# Patient Record
Sex: Female | Born: 1985 | Race: White | Hispanic: No | Marital: Single | Smoking: Never smoker
Health system: Southern US, Community
[De-identification: ages and names within clinical notes are randomized; demographics above are authoritative.]

## PROBLEM LIST (undated history)

## (undated) DIAGNOSIS — J329 Chronic sinusitis, unspecified: Secondary | ICD-10-CM

## (undated) DIAGNOSIS — R51 Headache: Secondary | ICD-10-CM

## (undated) HISTORY — PX: NASAL SINUS SURGERY: SHX719

## (undated) HISTORY — DX: Chronic sinusitis, unspecified: J32.9

## (undated) HISTORY — DX: Headache: R51

---

## 2005-07-13 ENCOUNTER — Ambulatory Visit: Payer: Self-pay | Admitting: Internal Medicine

## 2005-07-23 ENCOUNTER — Ambulatory Visit: Payer: Self-pay | Admitting: Cardiology

## 2006-02-15 ENCOUNTER — Ambulatory Visit: Payer: Self-pay | Admitting: Pulmonary Disease

## 2006-08-26 ENCOUNTER — Ambulatory Visit: Payer: Self-pay | Admitting: Internal Medicine

## 2007-03-18 ENCOUNTER — Ambulatory Visit: Payer: Self-pay | Admitting: Internal Medicine

## 2007-03-31 ENCOUNTER — Ambulatory Visit: Payer: Self-pay | Admitting: Cardiology

## 2007-10-31 ENCOUNTER — Ambulatory Visit: Payer: Self-pay | Admitting: Pulmonary Disease

## 2007-11-03 ENCOUNTER — Ambulatory Visit: Payer: Self-pay | Admitting: Pulmonary Disease

## 2007-11-03 LAB — CONVERTED CEMR LAB
Alkaline Phosphatase: 64 units/L (ref 39–117)
BUN: 8 mg/dL (ref 6–23)
Basophils Absolute: 0.1 10*3/uL (ref 0.0–0.1)
Basophils Relative: 0.7 % (ref 0.0–1.0)
Bilirubin, Direct: 0.2 mg/dL (ref 0.0–0.3)
CO2: 23 meq/L (ref 19–32)
Eosinophils Absolute: 0.2 10*3/uL (ref 0.0–0.6)
Eosinophils Relative: 1.9 % (ref 0.0–5.0)
GFR calc Af Amer: 135 mL/min
GFR calc non Af Amer: 111 mL/min
Glucose, Bld: 99 mg/dL (ref 70–99)
Hemoglobin: 13.1 g/dL (ref 12.0–15.0)
MCHC: 34.6 g/dL (ref 30.0–36.0)
MCV: 89.7 fL (ref 78.0–100.0)
Monocytes Relative: 5.7 % (ref 3.0–11.0)
Neutro Abs: 5.1 10*3/uL (ref 1.4–7.7)
RBC: 4.22 M/uL (ref 3.87–5.11)
Sodium: 138 meq/L (ref 135–145)
Total Bilirubin: 0.8 mg/dL (ref 0.3–1.2)
Total Protein: 7.2 g/dL (ref 6.0–8.3)

## 2007-11-10 ENCOUNTER — Ambulatory Visit: Payer: Self-pay | Admitting: Pulmonary Disease

## 2007-11-10 LAB — CONVERTED CEMR LAB
ALT: 15 U/L
AST: 19 U/L
Albumin: 4 g/dL
Alkaline Phosphatase: 64 U/L
BUN: 8 mg/dL
Basophils Absolute: 0.1 K/uL
Basophils Relative: 0.7 %
Bilirubin, Direct: 0.2 mg/dL
CO2: 23 meq/L
Calcium: 9.9 mg/dL
Chloride: 105 meq/L
Creatinine, Ser: 0.7 mg/dL
Eosinophils Absolute: 0.2 K/uL
Eosinophils Relative: 1.9 %
GFR calc Af Amer: 135 mL/min
GFR calc non Af Amer: 111 mL/min
Glucose, Bld: 99 mg/dL
HCT: 37.9 %
Hemoglobin: 13.1 g/dL
Lymphocytes Relative: 31.1 %
MCHC: 34.6 g/dL
MCV: 89.7 fL
Monocytes Absolute: 0.5 K/uL
Monocytes Relative: 5.7 %
Neutro Abs: 5.1 K/uL
Neutrophils Relative %: 60.6 %
Platelets: 316 K/uL
Potassium: 4.1 meq/L
RBC: 4.22 M/uL
RDW: 11.1 % — ABNORMAL LOW
Sodium: 138 meq/L
TSH: 4.58 u[IU]/mL
Total Bilirubin: 0.8 mg/dL
Total Protein: 7.2 g/dL
WBC: 8.6 10*3/microliter

## 2007-11-17 DIAGNOSIS — J329 Chronic sinusitis, unspecified: Secondary | ICD-10-CM | POA: Insufficient documentation

## 2007-11-17 DIAGNOSIS — R519 Headache, unspecified: Secondary | ICD-10-CM | POA: Insufficient documentation

## 2007-11-17 DIAGNOSIS — R51 Headache: Secondary | ICD-10-CM | POA: Insufficient documentation

## 2008-02-05 ENCOUNTER — Telehealth: Payer: Self-pay | Admitting: Adult Health

## 2008-06-28 ENCOUNTER — Ambulatory Visit: Payer: Self-pay | Admitting: Internal Medicine

## 2008-12-20 ENCOUNTER — Ambulatory Visit: Payer: Self-pay | Admitting: Pulmonary Disease

## 2009-01-20 ENCOUNTER — Encounter: Payer: Self-pay | Admitting: Adult Health

## 2009-01-20 ENCOUNTER — Ambulatory Visit: Payer: Self-pay | Admitting: Internal Medicine

## 2009-01-20 DIAGNOSIS — K5289 Other specified noninfective gastroenteritis and colitis: Secondary | ICD-10-CM

## 2009-01-24 ENCOUNTER — Encounter: Payer: Self-pay | Admitting: Adult Health

## 2009-01-24 LAB — CONVERTED CEMR LAB
BUN: 8 mg/dL (ref 6–23)
Basophils Absolute: 0 10*3/uL (ref 0.0–0.1)
Basophils Relative: 0.1 % (ref 0.0–3.0)
Bilirubin Urine: NEGATIVE
Creatinine, Ser: 0.6 mg/dL (ref 0.4–1.2)
Crystals: NEGATIVE
GFR calc Af Amer: 159 mL/min
GFR calc non Af Amer: 132 mL/min
Glucose, Bld: 94 mg/dL (ref 70–99)
Ketones, ur: NEGATIVE mg/dL
MCV: 90.6 fL (ref 78.0–100.0)
Monocytes Absolute: 0.5 10*3/uL (ref 0.1–1.0)
Nitrite: NEGATIVE
Platelets: 253 10*3/uL (ref 150–400)
RBC: 4.23 M/uL (ref 3.87–5.11)
Total Bilirubin: 0.3 mg/dL (ref 0.3–1.2)
Total Protein: 6.8 g/dL (ref 6.0–8.3)
WBC: 8.3 10*3/uL (ref 4.5–10.5)

## 2009-01-27 ENCOUNTER — Ambulatory Visit: Payer: Self-pay | Admitting: Adult Health

## 2009-01-27 ENCOUNTER — Ambulatory Visit (HOSPITAL_COMMUNITY): Admission: RE | Admit: 2009-01-27 | Discharge: 2009-01-27 | Payer: Self-pay | Admitting: Internal Medicine

## 2009-01-27 ENCOUNTER — Telehealth (INDEPENDENT_AMBULATORY_CARE_PROVIDER_SITE_OTHER): Payer: Self-pay | Admitting: *Deleted

## 2009-01-28 ENCOUNTER — Ambulatory Visit: Payer: Self-pay | Admitting: Internal Medicine

## 2009-01-28 LAB — CONVERTED CEMR LAB
Lipase: 22 units/L (ref 11.0–59.0)
hCG, Beta Chain, Quant, S: 2.65 milliintl units/mL

## 2009-02-01 ENCOUNTER — Emergency Department (HOSPITAL_COMMUNITY): Admission: EM | Admit: 2009-02-01 | Discharge: 2009-02-01 | Payer: Self-pay | Admitting: Family Medicine

## 2009-06-07 ENCOUNTER — Emergency Department (HOSPITAL_COMMUNITY): Admission: EM | Admit: 2009-06-07 | Discharge: 2009-06-07 | Payer: Self-pay | Admitting: Family Medicine

## 2009-07-25 ENCOUNTER — Telehealth (INDEPENDENT_AMBULATORY_CARE_PROVIDER_SITE_OTHER): Payer: Self-pay | Admitting: *Deleted

## 2009-09-08 ENCOUNTER — Telehealth: Payer: Self-pay | Admitting: Internal Medicine

## 2009-09-27 ENCOUNTER — Telehealth: Payer: Self-pay | Admitting: Internal Medicine

## 2009-10-25 ENCOUNTER — Encounter: Payer: Self-pay | Admitting: Internal Medicine

## 2009-11-15 ENCOUNTER — Telehealth: Payer: Self-pay | Admitting: Internal Medicine

## 2009-11-18 ENCOUNTER — Ambulatory Visit: Payer: Self-pay | Admitting: Cardiovascular Disease

## 2010-01-10 ENCOUNTER — Ambulatory Visit: Payer: Self-pay | Admitting: Internal Medicine

## 2010-01-10 DIAGNOSIS — J209 Acute bronchitis, unspecified: Secondary | ICD-10-CM

## 2010-01-16 ENCOUNTER — Telehealth: Payer: Self-pay | Admitting: Internal Medicine

## 2010-01-26 ENCOUNTER — Telehealth: Payer: Self-pay | Admitting: Internal Medicine

## 2010-11-04 ENCOUNTER — Telehealth: Payer: Self-pay | Admitting: Internal Medicine

## 2011-01-16 NOTE — Progress Notes (Signed)
Summary: SICK  Phone Note Call from Patient Call back at Home Phone [phone]   Caller: Patient Call For: Leisel Pinette Reason for Call: Acute Illness Summary of Call: Pt called stating that she continues to cough and sore throat. Pt was given a Zpak at OV. Please advise what pt can take or if pt can come in on Tuesday to be seen and get depo injection.  Initial call taken by: Reynaldo Minium CMA,  January 16, 2010 9:15 AM  Follow-up for Phone Call        I have some Avelox samples for her If this doesn't work we can call in some prednisone or she can come for depo inj. Try Delsym for cough otc. Follow-up by: Waymon Budge MD,  January 16, 2010 1:43 PM  Additional Follow-up for Phone Call Additional follow up Details #1::        Pt aware and will most likely send her mother to pick up samples. Reynaldo Minium CMA  January 16, 2010 1:47 PM

## 2011-01-16 NOTE — Assessment & Plan Note (Signed)
Summary: ov//td   CC:  cough-productive x 2 days-green; recently had sore throat;Denies N, V, and & D. No fever for past couple days; with sore thraot fever of 100.3..  History of Present Illness: 25 year old female with known history of Allergic Rhinitis  January 20, 2009--Presents for  2 weeks of upset stomach, bloating, gas, cramping, intermittent episodes, over last 2 weeks, increased over last few days. Some nausea, no vomitting, or diarrhea, BM normal, Denies chest pain, dyspnea, orthopnea, hemoptysis, fever, n/v/d, edema, recent travel or antibiotics, bloody stools, urinary symtptoms. , LMP nml. No OTC used.   January 28, 2009--Returns for persistent abd pain-lower cramping, upset stomach, nausea,  menses q39m on nuvaring. Does not feel better  since last OV - has been taking the nexium but stomach pain is getting worse, still nausea/vomting/hot flashes intermittently. Under alot of stress, work school, and family. Denies chest pain, dyspnea, orthopnea, hemoptysis,  edema, headache,recent travel or antibiotics, bloody stools. Abd US done normal, amylase/lipase -nml, preg neg.   January 10, 2010-  Acute visit. Bad sore throat with fever 1-2 weeks ago.Now morning cough green. Denies sinus congestion (hx sinus surgery). Residual hoarseness. No recent antibiotic. Taking Mucinex and Nyquil.   Preventive Screening-Counseling & Management  Alcohol-Tobacco     Smoking Status: never  Current Medications (verified): 1)  Nuvaring 0.12-0.015 Mg/24hr  Ring (Etonogestrel-Ethinyl Estradiol) .... As Directed 2)  Sonata 10 Mg  Caps (Zaleplon) .... As Needed 3)  Maxalt 10 Mg  Tabs (Rizatriptan Benzoate) .... Take As Directed For Headache  Allergies (verified): 1)  ! Vicodin (Hydrocodone-Acetaminophen)  Past History:  Past Medical History: Last updated: 06/28/2008  SINUSITIS (ICD-473.9) HEADACHE (ICD-784.0)    Family History: Last updated: 01/10/2010 Mother- living, COPD Father-  living  Social History: Last updated: 01/10/2010 Patient never smoked.  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  The patient denies anorexia, fever, weight loss, weight gain, vision loss, decreased hearing, hoarseness, chest pain, syncope, peripheral edema, headaches, hemoptysis, abdominal pain, and severe indigestion/heartburn.    Vital Signs:  Patient profile:   25 year old female Height:      62 inches Weight:      164.50 pounds BMI:     30.20 O2 Sat:      100 % on Room air Pulse rate:   83 / minute BP sitting:   112 / 82  (left arm) Cuff size:   regular  Vitals Entered By: Reynaldo Minium CMA (January 10, 2010 11:41 AM)  O2 Flow:  Room air  Physical Exam  Additional Exam:  GENERAL:  A/Ox3; pleasant & cooperative.NAD HEENT:  [place]/AT, EOM-wnl, PERRLA, EACs-clear, TMs-wnl, NOSE-watery sniffing , sinus tenderness, THROAT-clear & wnl.[place]  II NECK:  Supple w/ fair ROM; no JVD; normal carotid impulses w/o bruits; no thyromegaly or nodules palpated; no lymphadenopathy. CHEST:  Clear to P & A; intermittent dry cough but no wheeze or rhonchi HEART:  RRR, no m/r/g  heard ABDOMEN:  Soft & nt; EXT: Warm bilat,  no calf pain, edema, clubbing, pulses intact Skin: no rash/lesion    Impression & Recommendations:  Problem # 1:  BRONCHITIS, ACUTE (ICD-466.0)  This has been mostly viral pattern, but productive cough is lingering. Will give Z pak, encourage fluids.  Limited finances/ no insurance. Discussed calling as needed. The following medications were removed from the medication list:    Amoxicillin 500 Mg  Caps (Amoxicillin) .Marland Kitchen... 1 three times a day    Doxycycline Hyclate 100 Mg Tabs (Doxycycline hyclate) .Marland Kitchen... Take 2 today then 1 daily Her updated medication list for this problem includes:    Zithromax Z-pak 250 Mg Tabs (Azithromycin) .Marland Kitchen... 2 today then one daily  Medications Added to Medication List This Visit: 1)  Zithromax Z-pak 250 Mg Tabs (Azithromycin) .... 2 today then one daily  Other Orders: No Charge Patient Arrived (NCPA0) (NCPA0)  Patient Instructions: 1)  Please schedule a follow-up appointment as needed. 2)  Script for z pak. 3)  Fluids, mucinex and Nyquil as needed. Prescriptions: ZITHROMAX Z-PAK 250 MG TABS (AZITHROMYCIN) 2 today then one daily  #1 pak x 1   Entered and Authorized by:   Waymon Budge MD   Signed by:   Waymon Budge MD on 01/10/2010   Method used:   Print then Give to Patient   RxID:   [id number]

## 2011-01-16 NOTE — Progress Notes (Signed)
Summary: Perisitent rhinosinusitis, bronchitis  Phone Note Call from Patient   Summary of Call: Persistent cough over past month after doxycycline, Avelox x 5 days, Delsym, Mucinex. Occasional green, low grade fever and headaches. Hx sinus surgery. Plan- cough syrup, samples Avelox  Initial call taken by: Waymon Budge MD,  January 26, 2010 1:17 PM    New/Updated Medications: PROMETHAZINE-CODEINE 6.25-10 MG/5ML SYRP (PROMETHAZINE-CODEINE) 1 teaspoon four times a day as needed cough AVELOX 400 MG TABS (MOXIFLOXACIN HCL) 1 daily x 6 days Prescriptions: AVELOX 400 MG TABS (MOXIFLOXACIN HCL) 1 daily x 6 days  #6 x 0   Entered and Authorized by:   Waymon Budge MD   Signed by:   Waymon Budge MD on 01/26/2010   Method used:   Samples Given   RxID:   [id number] PROMETHAZINE-CODEINE 6.25-10 MG/5ML SYRP (PROMETHAZINE-CODEINE) 1 teaspoon four times a day as needed cough  #200 ml x 0   Entered and Authorized by:   Waymon Budge MD   Signed by:   Waymon Budge MD on 01/26/2010   Method used:   Print then Give to Patient   RxID:   [id number]

## 2011-01-16 NOTE — Progress Notes (Signed)
Summary: bronchitis- biaxin  Phone Note Call from Patient   Summary of Call: Patient called reporting inability to shake rhinitis/ bronchits syndrome that began over a week ago as a cold. Now working in [place], [place].  Plan- fluids, conservative Rx. Script for biaxin 500 mg two times a day x 7 days, ref x 3 called to [facility] [phone]. Initial call taken by: Waymon Budge MD,  November 04, 2010 6:07 PM    New/Updated Medications: CLARITHROMYCIN 500 MG TABS (CLARITHROMYCIN) 1 two times a day after meals Prescriptions: CLARITHROMYCIN 500 MG TABS (CLARITHROMYCIN) 1 two times a day after meals  #14 x 3   Entered and Authorized by:   Waymon Budge MD   Signed by:   Waymon Budge MD on 11/04/2010   Method used:   Telephoned to ...       [facility] [address]* (retail)       [address]       [place], [place]  [address]       Ph: [phone]       Fax:    RxID:   [id number]

## 2011-04-27 ENCOUNTER — Inpatient Hospital Stay (INDEPENDENT_AMBULATORY_CARE_PROVIDER_SITE_OTHER)
Admission: RE | Admit: 2011-04-27 | Discharge: 2011-04-27 | Disposition: A | Discharge status: Home / Self Care | Payer: Self-pay | Source: Ambulatory Visit | Attending: Emergency Medicine | Admitting: Emergency Medicine

## 2011-04-27 DIAGNOSIS — G43009 Migraine without aura, not intractable, without status migrainosus: Secondary | ICD-10-CM

## 2011-05-01 NOTE — Assessment & Plan Note (Signed)
[facility]                             PULMONARY OFFICE NOTE   Hailey Simpson, Hailey Simpson                  MRN:          [id number]  DATE:10/31/2007                            DOB:          1986/09/01    HISTORY OF PRESENT ILLNESS:  The patient is a very pleasant 25 year old  white female patient of Dr. Roxy Cedar with a history of rhinitis, who  presents today for an acute office visit.  The patient complains that  she has been having a lot of stress recently.  She is currently in  school full-time and working full-time.  She has felt as if she has not  been as happy as her usual self over the last 6-12 months.  She has  frequent episodes of crying and worries quite a bit.  The patient denies  any chest pain, palpitations, nausea, vomiting, rash or suicidal or  homicidal ideations.  The patient does feel that she does want to sleep  more than usual.  The patient had a short episode 3 years ago when she  was on Prozac for a couple of months, but did not seem to have a good  response with this.   PAST MEDICAL HISTORY:  Reviewed.   CURRENT MEDICATIONS:  Reviewed.   PHYSICAL EXAMINATION:  The patient is a very pleasant female in no acute  distress.  She is afebrile with stable vital signs.  HEENT:  Unremarkable.  NECK:  Supple without cervical adenopathy.  No JVD.  LUNGS:  Lung sounds are clear.  CARDIAC:  Regular rate and rhythm.  ABDOMEN:  Soft and nontender.  No palpable hepatosplenomegaly.  EXTREMITIES:  Warm without any calf cyanosis, clubbing or edema.   IMPRESSION AND PLAN:  Depression.  The patient is recommended on stress-  reducing activities such as exercise.  I have recommend that she be  referred over to Psychology to help with stress-coping management with  Dr. Laban Emperor Hires.  An appointment will be made accordingly.  The  patient is to begin Cymbalta 30 mg for 2 weeks and then 60 mg daily.  The patient will return back here in 4 weeks  in our office or sooner if  needed.  The patient is advised to call the office for sooner followup  if symptoms do not improve or worsen.     Rubye Oaks, NP  Electronically Signed      Clinton D. Maple Hudson, MD, [facility], FACP  Electronically Signed   TP/MedQ  DD: 10/31/2007  DT: 11/02/2007  Job #: [id number]

## 2011-05-04 NOTE — Assessment & Plan Note (Signed)
[facility]                               PULMONARY OFFICE NOTE   BERNESTINE, HOLSAPPLE                  MRN:          [id number]  DATE:08/26/2006                            DOB:          Oct 30, 1986    HISTORY OF PRESENT ILLNESS:  The patient is a very pleasant female patient  of Dr. Roxy Cedar who presents for an acute office visit.  The patient  complained of a 2-day history of nasal congestion, postnasal drip, dry  cough, and general malaise.  The patient denies any hemoptysis, purulent  sputum, chest pain, recent travel, or antibiotic use.   PAST MEDICAL HISTORY:  Was reviewed.   CURRENT MEDICATIONS:  Nuvaring.   ALLERGIES:  No known drug allergies.   PHYSICAL EXAMINATION:  GENERAL:  The patient is a pleasant female in no  acute distress.  VITAL SIGNS:  She is afebrile with stable  vital signs.  HEENT:  Nasopharynx shows some moderate erythema.  Nontender sinus  percussion.  Oropharynx clear.  TMs are normal.  NECK:  Supple without adenopathy.  LUNGS:  Lung sounds are clear.  CARDIAC:  Regular rate and rhythm.  ABDOMEN:  Soft, benign.  EXTREMITIES:  Warm without any edema.   IMPRESSION AND PLAN:  Acute upper respiratory infection, probable viral in  nature and acute rhinitis.  The patient will begin Nasacort AQ, 2 puffs  twice daily; Allegra-D 1 b.i.d., 5 day samples given.  Mucinex-DM twice  daily.  Endal HC as needed for cough.  The patient is return if no  improvement or symptoms worsen.                                   Rubye Oaks, NP                                Clinton D. Maple Hudson, MD, [facility], FACP   TP/MedQ  DD:  08/26/2006  DT:  08/27/2006  Job #:  [id number]

## 2011-05-04 NOTE — Assessment & Plan Note (Signed)
[facility]                             PULMONARY OFFICE NOTE   JULENA, BARBOUR                  MRN:          [id number]  DATE:03/18/2007                            DOB:          1986-05-20    PULMONARY OFFICE FOLLOWUP   PROBLEM:  1. Headache.  2. Sinusitis.   HISTORY:  Has been waking for the past week and a half with retro-  orbital pressures, especially on the right and in the right maxillary  region.  Somewhat bloody scant nasal discharge.  Ears and chest were  okay.  No real fever.  She feels this fits the pattern of her previous  sinus infections.   MEDICATIONS:  1. Nuvaring.  2. P.r.n. Sonata.   Drug intolerant VICODIN with itch.   OBJECTIVE:  Weight 147 pounds.  BP 102/76, pulse regular 84.  Room air  saturation 98%.  Nasal mucosa is a little bit reddened with moderate amount of non-  specific mucus, no polyps.  Pharynx is also a little bit reddened.  I do  not find adenopathy.  Voice quality is normal without strider.  Conjunctivae are clear.  Breathing is unlabored without cough or wheeze.   IMPRESSION:  Rhinitis or rhinosinusitis.   PLAN:  Mucinex D, saline nasal lavage,  Biaxin 500 mg b.i.d.  for 7  days.  Return p.r.n.     Clinton D. Maple Hudson, MD, [facility]ollman, FACP  Electronically Signed    CDY/MedQ  DD: 03/18/2007  DT: 03/19/2007  Job #: [id number]

## 2011-06-27 IMAGING — CT CT PARANASAL SINUSES LIMITED
1 of 2 series · 15 of 25 positions shown, 19 images · non-contrast
Comparison: 03/31/2007.

CLINICAL DATA: Sinusitis and recurrent headaches.  Pain and
pressure around the hives for 1.5 months.

CT PARANASAL SINUS LIMITED WITHOUT CONTRAST
TECHNIQUE: Multidetector CT images of the paranasal sinuses were
obtained in a single plane without contrast.

[Series 4: ltd sinus 3.0 h30s · axial · 0.29mm/px · z∈[-86,+12]mm · 15 of 24 slices shown, 19 images]
[im 2/24  brain]
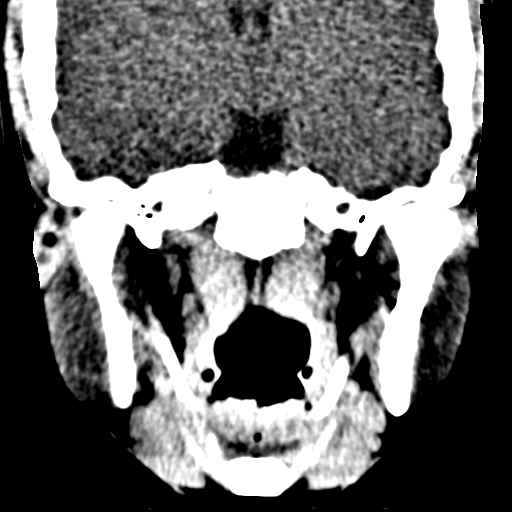
[im 2/24  bone]
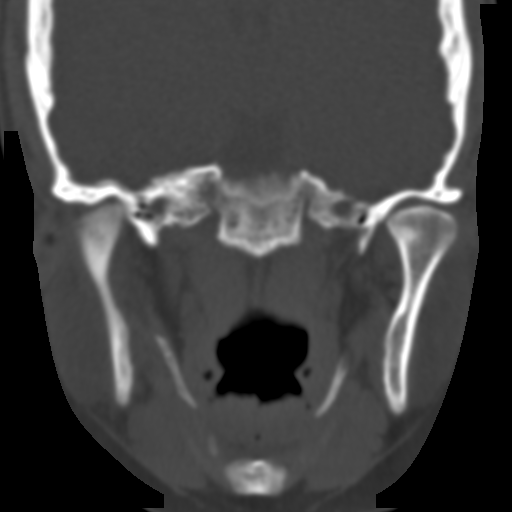
[im 4/24  bone]
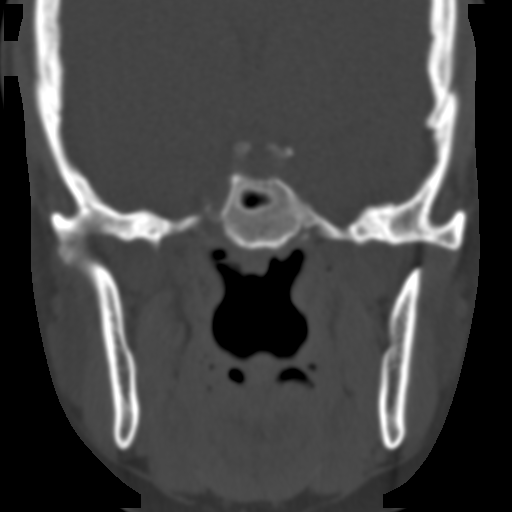
[im 5/24  bone]
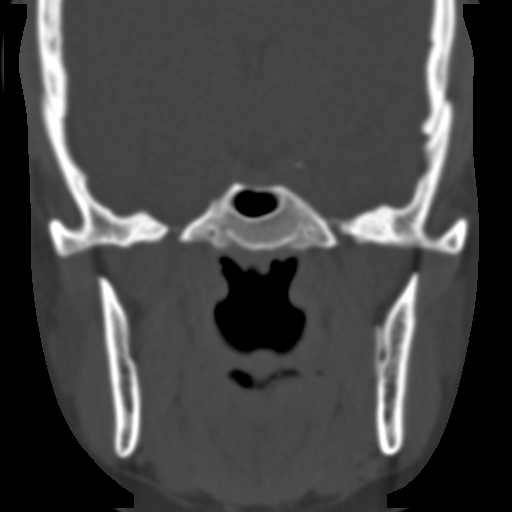
[im 7/24  bone]
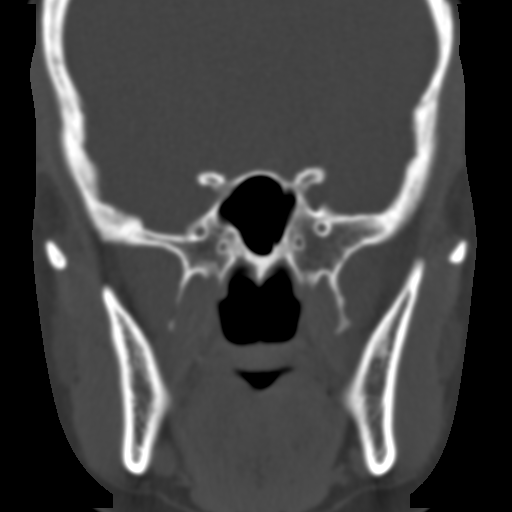
[im 8/24  brain]
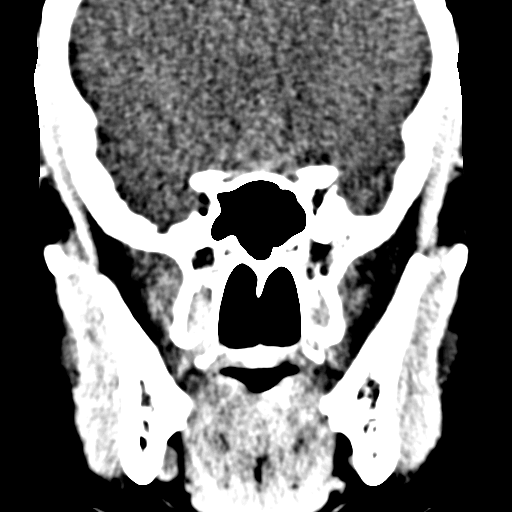
[im 8/24  bone]
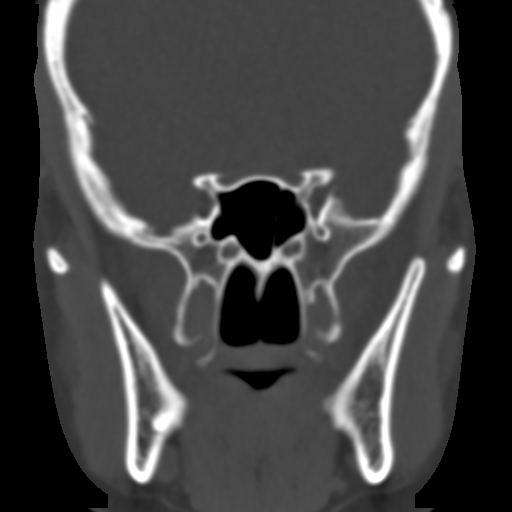
[im 10/24  bone]
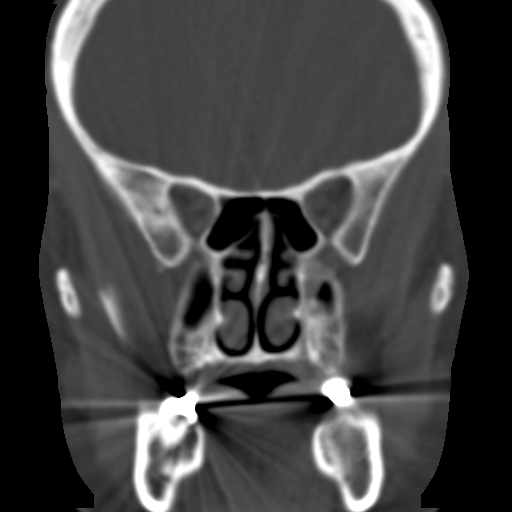
[im 11/24  bone]
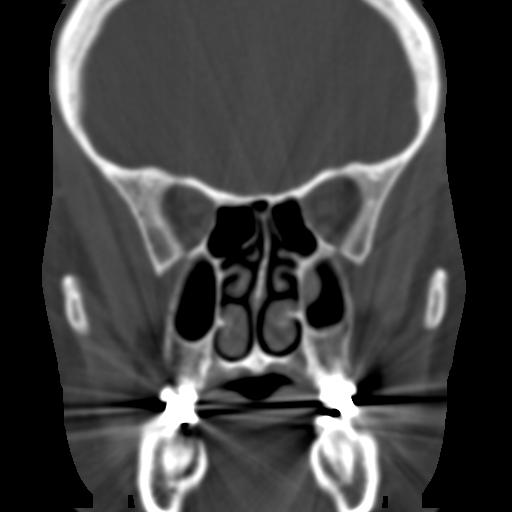
[im 13/24  bone]
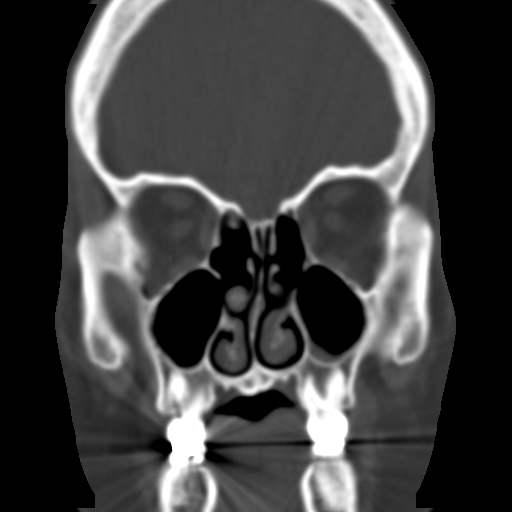
[im 14/24  brain]
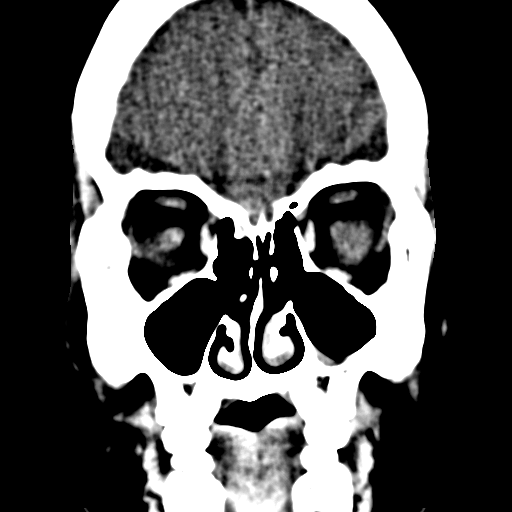
[im 14/24  bone]
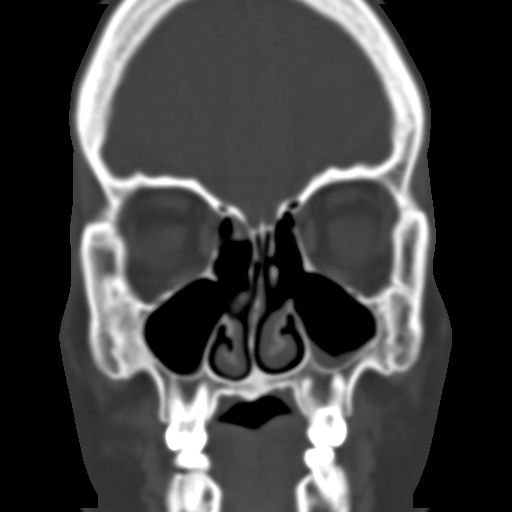
[im 15/24  bone]
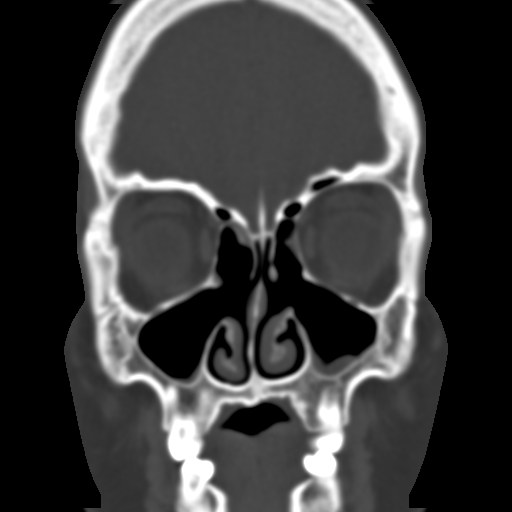
[im 17/24  bone]
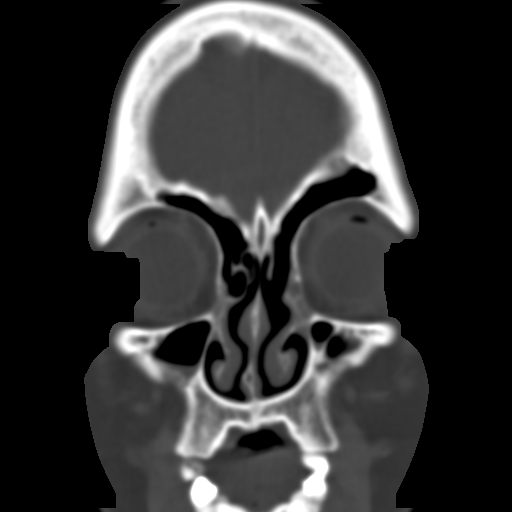
[im 18/24  bone]
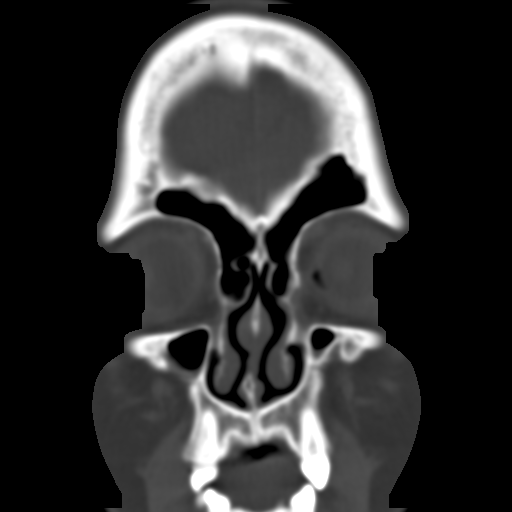
[im 20/24  brain]
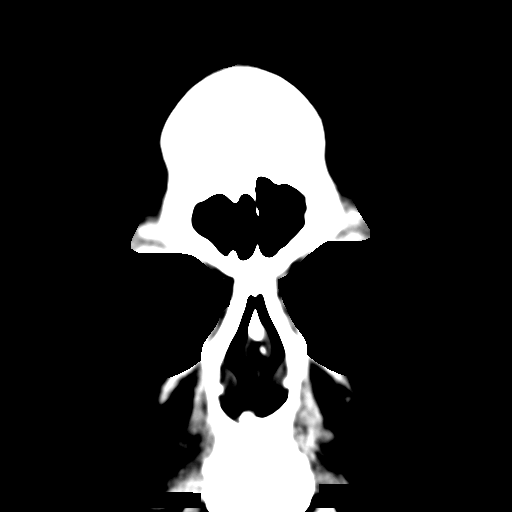
[im 20/24  bone]
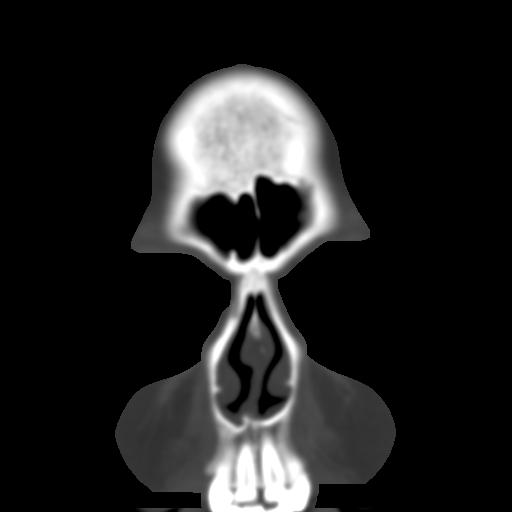
[im 21/24  bone]
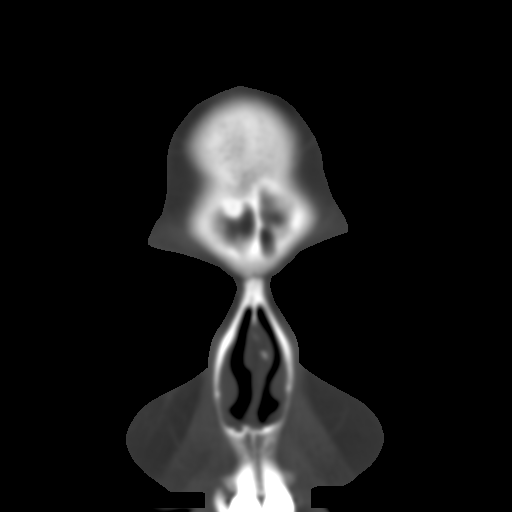
[im 23/24  bone]
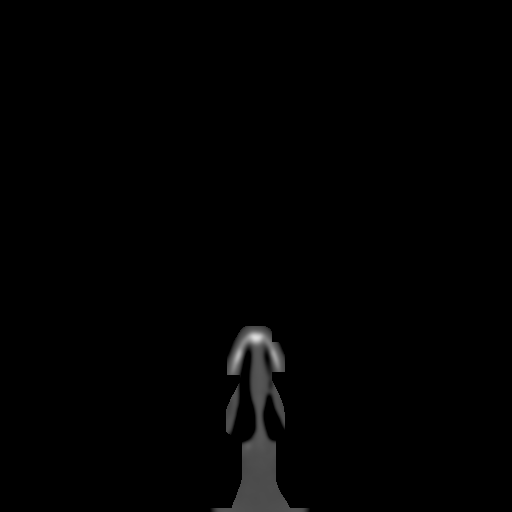

[15 of 25 positions shown; findings below may reference images not displayed]

FINDINGS: The patient is status post bilateral nasal surgery with
resection of the ostiomeatal complex.  Mild mucosal thickening is
worse on the left than right.  This is primarily dependent.  The
findings are less pronounced than on the prior study.  The patient
is status post bilateral ethmoidectomies.  The frontal sinuses and
sphenoid sinuses are clear.  The mastoid air cells are not imaged.
IMPRESSION: 1.  Status post extensive sinus surgery with by bilateral nasal
antral windows and bilateral ethmoidectomies.
2.  Mild mucosal thickening is seen dependently in the maxillary
sinuses bilaterally, all less pronounced than on the prior study.
This may be a low grade chronic finding.
3.  No evidence for acute sinusitis.

## 2011-06-29 ENCOUNTER — Encounter: Payer: Self-pay | Admitting: Adult Health

## 2011-07-02 ENCOUNTER — Encounter: Payer: Self-pay | Admitting: Adult Health

## 2011-07-02 ENCOUNTER — Other Ambulatory Visit (INDEPENDENT_AMBULATORY_CARE_PROVIDER_SITE_OTHER): Payer: BC Managed Care – PPO

## 2011-07-02 ENCOUNTER — Ambulatory Visit (INDEPENDENT_AMBULATORY_CARE_PROVIDER_SITE_OTHER): Payer: BC Managed Care – PPO | Admitting: Adult Health

## 2011-07-02 DIAGNOSIS — R51 Headache: Secondary | ICD-10-CM

## 2011-07-02 DIAGNOSIS — Z0000 Encounter for general adult medical examination without abnormal findings: Secondary | ICD-10-CM

## 2011-07-02 DIAGNOSIS — J329 Chronic sinusitis, unspecified: Secondary | ICD-10-CM

## 2011-07-02 LAB — LIPID PANEL
Cholesterol: 180 mg/dL (ref 0–200)
Triglycerides: 49 mg/dL (ref 0.0–149.0)

## 2011-07-02 LAB — HEPATIC FUNCTION PANEL
ALT: 15 U/L (ref 0–35)
Total Protein: 7.2 g/dL (ref 6.0–8.3)

## 2011-07-02 LAB — BASIC METABOLIC PANEL
BUN: 9 mg/dL (ref 6–23)
CO2: 25 mEq/L (ref 19–32)
Chloride: 104 mEq/L (ref 96–112)
Creatinine, Ser: 0.6 mg/dL (ref 0.4–1.2)
Glucose, Bld: 84 mg/dL (ref 70–99)

## 2011-07-02 LAB — CBC WITH DIFFERENTIAL/PLATELET
Basophils Relative: 0.7 % (ref 0.0–3.0)
Eosinophils Absolute: 0.2 10*3/uL (ref 0.0–0.7)
Lymphocytes Relative: 31.7 % (ref 12.0–46.0)
MCHC: 34.1 g/dL (ref 30.0–36.0)
Neutrophils Relative %: 59.2 % (ref 43.0–77.0)
RBC: 4.33 Mil/uL (ref 3.87–5.11)
WBC: 7.4 10*3/uL (ref 4.5–10.5)

## 2011-07-02 MED ORDER — RIZATRIPTAN BENZOATE 10 MG PO TABS: ORAL_TABLET | ORAL | Status: DC

## 2011-07-02 NOTE — Patient Instructions (Signed)
Saline nasal rinses As needed   Allegra 180mg  daily As needed   Advil cold and sinus As needed   Low fat cholestrol diet Exercise  follow up as planned with Dr. Maple Hudson.

## 2011-07-02 NOTE — Assessment & Plan Note (Signed)
Stress reducers Triggers.  As needed  Maxalt.

## 2011-07-02 NOTE — Progress Notes (Signed)
  Subjective:    Patient ID: Hailey Simpson, female    DOB: [birth year], 25 y.o.   MRN: [id number]  HPI 25 yo female with known hx of allergic rhinitis . She is followed for primary care by Dr. Fannie Knee.   07/02/2011 Health Maintenance  Pt presents today for routine follow up and med refills.  Says she has been in good health. She has new job working as Administrator, sports at hotel.  She has had some sinus pressure behind eye with tender glands. No discolored mucus  Or fever.  Continues with follow up with GYN with regular checkups. Her TD is UTD.  Says she is remaining active.  We discussed sunscreen, BSE, and exercise and diet.   MHA are less with infrequent use of Maxalt.     Review of Systems Constitutional:   No  weight loss, night sweats,  Fevers, chills, fatigue, or  lassitude.  HEENT:   No headaches,  Difficulty swallowing,  Tooth/dental problems, or  Sore throat,                No sneezing, itching, ear ache, + nasal congestion, post nasal drip,   CV:  No chest pain,  Orthopnea, PND, swelling in lower extremities, anasarca, dizziness, palpitations, syncope.   GI  No heartburn, indigestion, abdominal pain, nausea, vomiting, diarrhea, change in bowel habits, loss of appetite, bloody stools.   Resp: No shortness of breath with exertion or at rest.  No excess mucus, no productive cough,  No non-productive cough,  No coughing up of blood.  No change in color of mucus.  No wheezing.  No chest wall deformity  Skin: no rash or lesions.  GU: no dysuria, change in color of urine, no urgency or frequency.  No flank pain, no hematuria   MS:  No joint pain or swelling.  No decreased range of motion.  No back pain.  Psych:  No change in mood or affect. No depression or anxiety.  No memory loss.          Objective:   Physical Exam GEN: A/Ox3; pleasant , NAD, well nourished   HEENT:  [place]/AT,  EACs-clear, TMs-wnl, NOSE-clear, THROAT-clear, no lesions, no postnasal  drip or exudate noted.   NECK:  Supple w/ fair ROM; no JVD; normal carotid impulses w/o bruits; no thyromegaly or nodules palpated; no lymphadenopathy. Tender glands along left anterior cervical chain, no palpable adenopathy.   RESP  Clear  P & A; w/o, wheezes/ rales/ or rhonchi.no accessory muscle use, no dullness to percussion  CARD:  RRR, no m/r/g  , no peripheral edema, pulses intact, no cyanosis or clubbing.  GI:   Soft & nt; nml bowel sounds; no organomegaly or masses detected.  Musco: Warm bil, no deformities or joint swelling noted.   Neuro: alert, no focal deficits noted.   PERRLA   Skin: Warm, no lesions or rashes         Assessment & Plan:

## 2011-07-02 NOTE — Assessment & Plan Note (Signed)
Mild rhinitis flare -no active infection noted.  Advised on saline nasal rinses  Allegra and advil cold and sinus

## 2011-07-02 NOTE — Assessment & Plan Note (Signed)
Advised on diet and exercise  Continue with follow up with GYn

## 2011-09-28 ENCOUNTER — Encounter: Payer: Self-pay | Admitting: Internal Medicine

## 2011-09-28 ENCOUNTER — Ambulatory Visit (INDEPENDENT_AMBULATORY_CARE_PROVIDER_SITE_OTHER): Payer: BC Managed Care – PPO | Admitting: Internal Medicine

## 2011-09-28 ENCOUNTER — Ambulatory Visit: Payer: BC Managed Care – PPO | Admitting: Internal Medicine

## 2011-09-28 ENCOUNTER — Other Ambulatory Visit: Payer: BC Managed Care – PPO

## 2011-09-28 VITALS — BP 120/86 | HR 94 | Ht 62.0 in | Wt 163.4 lb

## 2011-09-28 DIAGNOSIS — J310 Chronic rhinitis: Secondary | ICD-10-CM

## 2011-09-28 DIAGNOSIS — J329 Chronic sinusitis, unspecified: Secondary | ICD-10-CM

## 2011-09-28 NOTE — Progress Notes (Signed)
Patient ID: Hailey Simpson, female    DOB: [birth year], 25 y.o.   MRN: [id number]  HPI 25 yo female with known hx of allergic rhinitis . She is followed for primary care by Dr. Fannie Knee.   07/02/2011 Health Maintenance  Pt presents today for routine follow up and med refills.  Says she has been in good health. She has new job working as Administrator, sports at hotel.  She has had some sinus pressure behind eye with tender glands. No discolored mucus  Or fever.  Continues with follow up with GYN with regular checkups. Her TD is UTD.  Says she is remaining active.  We discussed sunscreen, BSE, and exercise and diet.  MHA are less with infrequent use of Maxalt.   09/28/11- 36 yo nonsmoking female with known hx of allergic rhinitis and previous sinus surgery complicated by migraine headache .  She had laboratory confirmed influenza treated with Tamiflu and finally resolving last week. She is back to baseline after that now.  She is a chronic problem with sinus pressure headaches. These have been frequent almost constant in the last month or 2, felt as pressure discomfort behind her cheek bones. Persistent sensitive itching in nose and eyes. Feels dizzy if she bends over but does not get worse pressure discomfort with that maneuver. She lives in [facility] and notices that she gets worse as she drives here. Doing saline nasal rinse has helped some. Occasionally she can blow out some mucus, usually thick white. She is only having head congestion discomfort, with no chest related cough or wheeze. Treating herself with Advil cold and sinus products about twice a week (ibuprofen plus Sudafed 30 mg).  ROS See HPI Constitutional:   No-   weight loss, night sweats, fevers, chills, fatigue, lassitude. HEENT:   +  headaches, no-difficulty swallowing, tooth/dental problems, sore throat,       Some sneezing, itching, ear ache,+ nasal congestion, post nasal drip,  CV:  No-   chest  pain, orthopnea, PND, swelling in lower extremities, anasarca, +dizziness sometimes with leaning over, No- palpitations Resp: No-   shortness of breath with exertion or at rest.              No-   productive cough,  No non-productive cough,  No-  coughing up of blood.              No-   change in color of mucus.  No- wheezing.   Skin: No-   rash or lesions. GI:  No-   heartburn, indigestion, abdominal pain, nausea, vomiting, diarrhea,                 change in bowel habits, loss of appetite GU: No-   dysuria, change in color of urine, no urgency or frequency.  No- flank pain. MS:  No-   joint pain or swelling.  No- decreased range of motion.  No- back pain. Neuro- grossly normal to observation, Or:  Psych:  No- change in mood or affect. No depression or anxiety.  No memory loss.  Objective:   Physical Exam General- Alert, Oriented, Affect-appropriate, Distress- none acute Skin- rash-none, lesions- none, excoriation- none. Tattoo left foot Lymphadenopathy- none Head- atraumatic            Eyes- Gross vision intact, PERRLA, conjunctivae clear secretions. Mild periorbital edema            Ears- Hearing, canals-normal  Nose- turbinate edema and possible postoperative change especially on the right, no-Septal dev, mucus, polyps, erosion, perforation             Throat- Mallampati II , mucosa red posteriorly , drainage- none, tonsils- atrophic Neck- flexible , trachea midline, no stridor , thyroid nl, carotid no bruit Chest - symmetrical excursion , unlabored           Heart/CV- RRR , no murmur , no gallop  , no rub, nl s1 s2                           - JVD- none , edema- none, stasis changes- none, varices- none           Lung- clear to P&A, wheeze- none, cough- none , dullness-none, rub- none           Chest wall-  Abd- tender-no, distended-no, bowel sounds-present, HSM- no Br/ Gen/ Rectal- Not done, not indicated Extrem- cyanosis- none, clubbing, none, atrophy- none, strength-  nl Neuro- grossly intact to observation

## 2011-09-28 NOTE — Patient Instructions (Signed)
Try otc cromolyn/ Nasalcrom nasal spray  Try Sudafed 120 mg/ 12 hour, not more than twice daily and only if needed You can take plain Advil etc with it if needed  I will pull old paper chart for any old test results  Order- lab - Allergy profile    Dx chronic rhinitis

## 2011-09-29 ENCOUNTER — Encounter: Payer: Self-pay | Admitting: Internal Medicine

## 2011-09-29 NOTE — Assessment & Plan Note (Addendum)
Her biggest problem is congestion/mucosal edema maybe with some secondary obstruction. Previous sinus CT was clear without active disease. She had been compliant with sustained trials of nasal steroid sprays in the past, without recognized benefit. Plan-try Nasalcrom and higher dose Sudafed as discussed

## 2011-10-02 LAB — ALLERGY FULL PROFILE
Allergen, D pternoyssinus,d7: <0.1 kU/L (ref <0.35)
Allergen,Goose feathers, e70: <0.1 kU/L (ref <0.35)
Aspergillus fumigatus, IgG: <0.1 kU/L (ref <0.35)
Bahia Grass: <0.1 kU/L (ref <0.35)
Common Ragweed: <0.1 kU/L (ref <0.35)
D. farinae: <0.1 kU/L (ref <0.35)
Elm IgE: <0.1 kU/L (ref <0.35)
G009 Red Top: <0.1 kU/L (ref <0.35)
House Dust Hollister: <0.1 kU/L (ref <0.35)
Oak: <0.1 kU/L (ref <0.35)
Plantain: <0.1 kU/L (ref <0.35)
Stemphylium Botryosum: <0.1 kU/L (ref <0.35)
Sycamore Tree: <0.1 kU/L (ref <0.35)
Timothy Grass: <0.1 kU/L (ref <0.35)

## 2011-10-05 NOTE — Progress Notes (Signed)
Quick Note:  Pt aware of results. ______ 

## 2012-03-06 ENCOUNTER — Telehealth: Payer: Self-pay | Admitting: Internal Medicine

## 2012-03-06 ENCOUNTER — Other Ambulatory Visit: Payer: Self-pay | Admitting: Internal Medicine

## 2012-03-06 MED ORDER — RIZATRIPTAN BENZOATE 10 MG PO TABS: ORAL_TABLET | ORAL | Status: DC

## 2012-03-06 NOTE — Telephone Encounter (Signed)
Mother emailed  Request from daughter to refill Maxalt as she comes back to town. Chronic headache/ migraine.

## 2012-03-24 ENCOUNTER — Telehealth: Payer: Self-pay | Admitting: Internal Medicine

## 2012-03-24 MED ORDER — FLUTICASONE PROPIONATE 50 MCG/ACT NA SUSP: NASAL | Status: DC

## 2012-03-24 NOTE — Telephone Encounter (Signed)
Increased allergic rhinitis and conjunctivitis. I advised her to take Allegra-D in AM, plain Allegra in PM.  Adding Flonase/ fluticasone nasal spray # 1, refill prn 1-2 sprays each nostril, every night at bedtime  Called to [facility]  [phone]

## 2012-08-07 ENCOUNTER — Telehealth: Payer: Self-pay | Admitting: Internal Medicine

## 2012-08-07 NOTE — Telephone Encounter (Signed)
Called to report sinus infection. Plan- augmentin  875 mg, # 20, 1 bid, no ref-called to [facility] on [address] [phone]

## 2012-12-17 HISTORY — PX: OVARIAN CYST SURGERY: SHX726

## 2015-06-08 ENCOUNTER — Telehealth: Payer: Self-pay | Admitting: Internal Medicine

## 2015-06-08 MED ORDER — AZITHROMYCIN 250 MG PO TABS: ORAL_TABLET | ORAL | Status: DC

## 2015-06-08 MED ORDER — PROMETHAZINE-CODEINE 6.25-10 MG/5ML PO SYRP: 5.0000 mL | ORAL_SOLUTION | Freq: Four times a day (QID) | ORAL | Status: DC | PRN

## 2015-06-08 NOTE — Addendum Note (Signed)
Addended by: Jetty Duhamel D on: 06/08/2015 03:18 PM   Modules accepted: Orders

## 2015-06-08 NOTE — Telephone Encounter (Signed)
Head and chest cold won't clear. Hard persistent cough.

## 2015-06-08 NOTE — Telephone Encounter (Signed)
Failure to resolve head and chest cold with sleep disrupting cough Plan- Z pak/ ref x 1 and prometh codeine CS called to [facility] [place] [phone]

## 2015-06-09 NOTE — Telephone Encounter (Signed)
Duplicate

## 2016-08-03 ENCOUNTER — Telehealth: Payer: Self-pay | Admitting: Internal Medicine

## 2016-08-03 MED ORDER — AMOXICILLIN-POT CLAVULANATE 875-125 MG PO TABS: 1.0000 | ORAL_TABLET | Freq: Two times a day (BID) | ORAL | 1 refills | Status: DC

## 2016-08-03 NOTE — Telephone Encounter (Signed)
Reports sx c/w acute bilateral maxillary sinusitis Plan- [place] [facility] [place]   [phone]

## 2017-10-17 ENCOUNTER — Encounter: Payer: Self-pay | Admitting: *Deleted

## 2017-10-29 NOTE — Discharge Instructions (Signed)
[facility] °[facility] °ENDOSCOPIC SINUS SURGERY °[facility] ° °What is Functional Endoscopic Sinus Surgery? ° The Surgery involves making the natural openings of the sinuses larger by removing the bony partitions that separate the sinuses from the nasal cavity.  The natural sinus lining is preserved as much as possible to allow the sinuses to resume normal function after the surgery.  In some patients nasal polyps (excessively swollen lining of the sinuses) may be removed to relieve obstruction of the sinus openings.  The surgery is performed through the nose using lighted scopes, which eliminates the need for incisions on the face.  A septoplasty is a different procedure which is sometimes performed with sinus surgery.  It involves straightening the boy partition that separates the two sides of your nose.  A crooked or deviated septum may need repair if is obstructing the sinuses or nasal airflow.  Turbinate reduction is also often performed during sinus surgery.  The turbinates are bony proturberances from the side walls of the nose which swell and can obstruct the nose in patients with sinus and allergy problems.  Their size can be surgically reduced to help relieve nasal obstruction. ° °What Can Sinus Surgery Do For Me? ° Sinus surgery can reduce the frequency of sinus infections requiring antibiotic treatment.  This can provide improvement in nasal congestion, post-nasal drainage, facial pressure and nasal obstruction.  Surgery will NOT prevent you from ever having an infection again, so it usually only for patients who get infections 4 or more times yearly requiring antibiotics, or for infections that do not clear with antibiotics.  It will not cure nasal allergies, so patients with allergies may still require medication to treat their allergies after surgery. Surgery may improve headaches related to sinusitis, however, some people will continue to  require medication to control sinus headaches related to allergies.  Surgery will do nothing for other forms of headache (migraine, tension or cluster). ° °What Are the Risks of Endoscopic Sinus Surgery? ° Current techniques allow surgery to be performed safely with little risk, however, there are rare complications that patients should be aware of.  Because the sinuses are located around the eyes, there is risk of eye injury, including blindness, though again, this would be quite rare. This is usually a result of bleeding behind the eye during surgery, which puts the vision oat risk, though there are treatments to protect the vision and prevent permanent disrupted by surgery causing a leak of the spinal fluid that surrounds the brain.  More serious complications would include bleeding inside the brain cavity or damage to the brain.  Again, all of these complications are uncommon, and spinal fluid leaks can be safely managed surgically if they occur.  The most common complication of sinus surgery is bleeding from the nose, which may require packing or cauterization of the nose.  Continued sinus have polyps may experience recurrence of the polyps requiring revision surgery.  Alterations of sense of smell or injury to the tear ducts are also rare complications.  ° °What is the Surgery Like, and what is the Recovery? ° The Surgery usually takes a couple of hours to perform, and is usually performed under a general anesthetic (completely asleep).  Patients are usually discharged home after a couple of hours.  Sometimes during surgery it is necessary to pack the nose to control bleeding, and the packing is left in place for 24 - 48 hours, and removed by your surgeon.    If a septoplasty was performed during the procedure, there is often a splint placed which must be removed after 5-7 days.   °Discomfort: Pain is usually mild to moderate, and can be controlled by prescription pain medication or acetaminophen (Tylenol).   Aspirin, Ibuprofen (Advil, Motrin), or Naprosyn (Aleve) should be avoided, as they can cause increased bleeding.  Most patients feel sinus pressure like they have a bad head cold for several days.  Sleeping with your head elevated can help reduce swelling and facial pressure, as can ice packs over the face.  A humidifier may be helpful to keep the mucous and blood from drying in the nose.  ° °Diet: There are no specific diet restrictions, however, you should generally start with clear liquids and a light diet of bland foods because the anesthetic can cause some nausea.  Advance your diet depending on how your stomach feels.  Taking your pain medication with food will often help reduce stomach upset which pain medications can cause. ° °Nasal Saline Irrigation: It is important to remove blood clots and dried mucous from the nose as it is healing.  This is done by having you irrigate the nose at least 3 - 4 times daily with a salt water solution.  We recommend using NeilMed Sinus Rinse (available at the drug store).  Fill the squeeze bottle with the solution, bend over a sink, and insert the tip of the squeeze bottle into the nose ½ of an inch.  Point the tip of the squeeze bottle towards the inside corner of the eye on the same side your irrigating.  Squeeze the bottle and gently irrigate the nose.  If you bend forward as you do this, most of the fluid will flow back out of the nose, instead of down your throat.   The solution should be warm, near body temperature, when you irrigate.   Each time you irrigate, you should use a full squeeze bottle.  ° °Note that if you are instructed to use Nasal Steroid Sprays at any time after your surgery, irrigate with saline BEFORE using the steroid spray, so you do not wash it all out of the nose. °Another product, Nasal Saline Gel (such as AYR Nasal Saline Gel) can be applied in each nostril 3 - 4 times daily to moisture the nose and reduce scabbing or crusting. ° °Bleeding:   Bloody drainage from the nose can be expected for several days, and patients are instructed to irrigate their nose frequently with salt water to help remove mucous and blood clots.  The drainage may be dark red or brown, though some fresh blood may be seen intermittently, especially after irrigation.  Do not blow you nose, as bleeding may occur. If you must sneeze, keep your mouth open to allow air to escape through your mouth. ° °If heavy bleeding occurs: Irrigate the nose with saline to rinse out clots, then spray the nose 3 - 4 times with Afrin Nasal Decongestant Spray.  The spray will constrict the blood vessels to slow bleeding.  Pinch the lower half of your nose shut to apply pressure, and lay down with your head elevated.  Ice packs over the nose may help as well. If bleeding persists despite these measures, you should notify your doctor.  Do not use the Afrin routinely to control nasal congestion after surgery, as it can result in worsening congestion and may affect healing.  ° ° ° °Activity: Return to work varies among patients. Most patients will be   out of work at least 5 - 7 days to recover.  Patient may return to work after they are off of narcotic pain medication, and feeling well enough to perform the functions of their job.  Patients must avoid heavy lifting (over 10 pounds) or strenuous physical for 2 weeks after surgery, so your employer may need to assign you to light duty, or keep you out of work longer if light duty is not possible.  NOTE: you should not drive, operate dangerous machinery, do any mentally demanding tasks or make any important legal or financial decisions while on narcotic pain medication and recovering from the general anesthetic.  °  °Call Your Doctor Immediately if You Have Any of the Following: °1. Bleeding that you cannot control with the above measures °2. Loss of vision, double vision, bulging of the eye or black eyes. °3. Fever over 101 degrees °4. Neck stiffness with  severe headache, fever, nausea and change in mental state. °You are always encourage to call anytime with concerns, however, please call with requests for pain medication refills during office hours. ° °Office Endoscopy: During follow-up visits your doctor will remove any packing or splints that may have been placed and evaluate and clean your sinuses endoscopically.  Topical anesthetic will be used to make this as comfortable as possible, though you may want to take your pain medication prior to the visit.  How often this will need to be done varies from patient to patient.  After complete recovery from the surgery, you may need follow-up endoscopy from time to time, particularly if there is concern of recurrent infection or nasal polyps. ° ° °General Anesthesia, Adult, Care After °These instructions provide you with information about caring for yourself after your procedure. Your health care provider may also give you more specific instructions. Your treatment has been planned according to current medical practices, but problems sometimes occur. Call your health care provider if you have any problems or questions after your procedure. °What can I expect after the procedure? °After the procedure, it is common to have: °· Vomiting. °· A sore throat. °· Mental slowness. ° °It is common to feel: °· Nauseous. °· Cold or shivery. °· Sleepy. °· Tired. °· Sore or achy, even in parts of your body where you did not have surgery. ° °Follow these instructions at home: °For at least 24 hours after the procedure: °· Do not: °? Participate in activities where you could fall or become injured. °? Drive. °? Use heavy machinery. °? Drink alcohol. °? Take sleeping pills or medicines that cause drowsiness. °? Make important decisions or sign legal documents. °? Take care of children on your own. °· Rest. °Eating and drinking °· If you vomit, drink water, juice, or soup when you can drink without vomiting. °· Drink enough fluid to  keep your urine clear or pale yellow. °· Make sure you have little or no nausea before eating solid foods. °· Follow the diet recommended by your health care provider. °General instructions °· Have a responsible adult stay with you until you are awake and alert. °· Return to your normal activities as told by your health care provider. Ask your health care provider what activities are safe for you. °· Take over-the-counter and prescription medicines only as told by your health care provider. °· If you smoke, do not smoke without supervision. °· Keep all follow-up visits as told by your health care provider. This is important. °Contact a health care provider if: °· You   continue to have nausea or vomiting at home, and medicines are not helpful. °· You cannot drink fluids or start eating again. °· You cannot urinate after 8-12 hours. °· You develop a skin rash. °· You have fever. °· You have increasing redness at the site of your procedure. °Get help right away if: °· You have difficulty breathing. °· You have chest pain. °· You have unexpected bleeding. °· You feel that you are having a life-threatening or urgent problem. °This information is not intended to replace advice given to you by your health care provider. Make sure you discuss any questions you have with your health care provider. °Document Released: 03/11/2001 Document Revised: 05/07/2016 Document Reviewed: 11/17/2015 °Elsevier Interactive Patient Education © 2018 [employer] ° °

## 2017-10-31 ENCOUNTER — Ambulatory Visit: Payer: BLUE CROSS/BLUE SHIELD | Admitting: Anesthesiology

## 2017-10-31 ENCOUNTER — Encounter
Admission: RE | Disposition: A | Discharge status: Home / Self Care | Payer: Self-pay | Source: Ambulatory Visit | Attending: Otolaryngology

## 2017-10-31 ENCOUNTER — Ambulatory Visit
Admission: RE | Admit: 2017-10-31 | Discharge: 2017-10-31 | Disposition: A | Discharge status: Home / Self Care | Payer: BLUE CROSS/BLUE SHIELD | Source: Ambulatory Visit | Attending: Otolaryngology | Admitting: Otolaryngology

## 2017-10-31 DIAGNOSIS — Z79899 Other long term (current) drug therapy: Secondary | ICD-10-CM | POA: Diagnosis not present

## 2017-10-31 DIAGNOSIS — J322 Chronic ethmoidal sinusitis: Secondary | ICD-10-CM | POA: Insufficient documentation

## 2017-10-31 DIAGNOSIS — Z8249 Family history of ischemic heart disease and other diseases of the circulatory system: Secondary | ICD-10-CM | POA: Diagnosis not present

## 2017-10-31 DIAGNOSIS — Z8349 Family history of other endocrine, nutritional and metabolic diseases: Secondary | ICD-10-CM | POA: Diagnosis not present

## 2017-10-31 DIAGNOSIS — J342 Deviated nasal septum: Secondary | ICD-10-CM | POA: Insufficient documentation

## 2017-10-31 DIAGNOSIS — J321 Chronic frontal sinusitis: Secondary | ICD-10-CM | POA: Diagnosis present

## 2017-10-31 DIAGNOSIS — Z833 Family history of diabetes mellitus: Secondary | ICD-10-CM | POA: Diagnosis not present

## 2017-10-31 HISTORY — PX: FRONTAL SINUS EXPLORATION: SHX6591

## 2017-10-31 HISTORY — PX: ETHMOIDECTOMY: SHX5197

## 2017-10-31 HISTORY — PX: IMAGE GUIDED SINUS SURGERY: SHX6570

## 2017-10-31 SURGERY — SINUS SURGERY, WITH IMAGING GUIDANCE
Anesthesia: General | Laterality: Bilateral | Wound class: Clean Contaminated

## 2017-10-31 MED ORDER — LIDOCAINE-EPINEPHRINE 1 %-1:100000 IJ SOLN
INTRAMUSCULAR | Status: DC | PRN
Start: 2017-10-31 — End: 2017-10-31
  Administered 2017-10-31: 3 mL via INTRADERMAL

## 2017-10-31 MED ORDER — OXYMETAZOLINE HCL 0.05 % NA SOLN
2.0000 | Freq: Once | NASAL | Status: AC
  Administered 2017-10-31: 2 via NASAL

## 2017-10-31 MED ORDER — SUCCINYLCHOLINE CHLORIDE 20 MG/ML IJ SOLN
INTRAMUSCULAR | Status: DC | PRN
  Administered 2017-10-31: 100 mg via INTRAVENOUS

## 2017-10-31 MED ORDER — PROPOFOL 10 MG/ML IV BOLUS
INTRAVENOUS | Status: DC | PRN
  Administered 2017-10-31: 200 mg via INTRAVENOUS

## 2017-10-31 MED ORDER — OXYCODONE HCL 5 MG PO TABS: 5.0000 mg | ORAL_TABLET | Freq: Once | ORAL | Status: DC | PRN

## 2017-10-31 MED ORDER — PHENYLEPHRINE HCL 0.5 % NA SOLN
NASAL | Status: DC | PRN
  Administered 2017-10-31: 15 mL via TOPICAL

## 2017-10-31 MED ORDER — GLYCOPYRROLATE 0.2 MG/ML IJ SOLN
INTRAMUSCULAR | Status: DC | PRN
  Administered 2017-10-31: 0.6 mg via INTRAVENOUS

## 2017-10-31 MED ORDER — FENTANYL CITRATE (PF) 100 MCG/2ML IJ SOLN: 25.0000 ug | INTRAMUSCULAR | Status: DC | PRN

## 2017-10-31 MED ORDER — DEXTROSE 5 % IV SOLN
2000.0000 mg | Freq: Once | INTRAVENOUS | Status: AC
  Administered 2017-10-31: 2000 mg via INTRAVENOUS

## 2017-10-31 MED ORDER — MIDAZOLAM HCL 5 MG/5ML IJ SOLN
INTRAMUSCULAR | Status: DC | PRN
  Administered 2017-10-31: 2 mg via INTRAVENOUS

## 2017-10-31 MED ORDER — OXYCODONE HCL 5 MG/5ML PO SOLN: 5.0000 mg | Freq: Once | ORAL | Status: DC | PRN

## 2017-10-31 MED ORDER — LIDOCAINE HCL (CARDIAC) 20 MG/ML IV SOLN
INTRAVENOUS | Status: DC | PRN
  Administered 2017-10-31: 50 mg via INTRAVENOUS

## 2017-10-31 MED ORDER — ONDANSETRON HCL 4 MG/2ML IJ SOLN
INTRAMUSCULAR | Status: DC | PRN
  Administered 2017-10-31: 4 mg via INTRAVENOUS

## 2017-10-31 MED ORDER — LACTATED RINGERS IV SOLN
INTRAVENOUS | Status: DC
  Administered 2017-10-31: 10:00:00 via INTRAVENOUS

## 2017-10-31 MED ORDER — NEOSTIGMINE METHYLSULFATE 10 MG/10ML IV SOLN
INTRAVENOUS | Status: DC | PRN
  Administered 2017-10-31: 3 mg via INTRAVENOUS

## 2017-10-31 MED ORDER — DEXAMETHASONE SODIUM PHOSPHATE 4 MG/ML IJ SOLN
INTRAMUSCULAR | Status: DC | PRN
  Administered 2017-10-31: 10 mg via INTRAVENOUS

## 2017-10-31 MED ORDER — ROCURONIUM BROMIDE 100 MG/10ML IV SOLN
INTRAVENOUS | Status: DC | PRN
  Administered 2017-10-31: 10 mg via INTRAVENOUS
  Administered 2017-10-31: 30 mg via INTRAVENOUS
  Administered 2017-10-31: 10 mg via INTRAVENOUS

## 2017-10-31 MED ORDER — FENTANYL CITRATE (PF) 100 MCG/2ML IJ SOLN
INTRAMUSCULAR | Status: DC | PRN
Start: 2017-10-31 — End: 2017-10-31
  Administered 2017-10-31: 100 ug via INTRAVENOUS

## 2017-10-31 MED ORDER — ACETAMINOPHEN 325 MG PO TABS
975.0000 mg | ORAL_TABLET | Freq: Once | ORAL | Status: AC
  Administered 2017-10-31: 975 mg via ORAL

## 2017-10-31 MED ORDER — TRAMADOL HCL 50 MG PO TABS
50.0000 mg | ORAL_TABLET | Freq: Once | ORAL | Status: AC
  Administered 2017-10-31: 50 mg via ORAL

## 2017-10-31 SURGICAL SUPPLY — 33 items
BATTERY INSTRU NAVIGATION (MISCELLANEOUS) ×10 IMPLANT
BTRY SRG DRVR LF (MISCELLANEOUS) ×3
CANISTER SUCT 1200ML W/VALVE (MISCELLANEOUS) ×3 IMPLANT
CATH IV 18X1 1/4 SAFELET (CATHETERS) ×3 IMPLANT
COAGULATOR SUCT 8FR VV (MISCELLANEOUS) ×3 IMPLANT
DRAPE HEAD BAR (DRAPES) ×3 IMPLANT
GLOVE PI ULTRA LF STRL 7.5 (GLOVE) ×2 IMPLANT
GLOVE PI ULTRA NON LATEX 7.5 (GLOVE) ×4
IV CATH 18X1 1/4 SAFELET (CATHETERS) ×1
IV NS 500ML (IV SOLUTION) ×3
IV NS 500ML BAXH (IV SOLUTION) ×1 IMPLANT
KIT ROOM TURNOVER OR (KITS) ×3 IMPLANT
NDL ANESTHESIA 27G X 3.5 (NEEDLE) ×1 IMPLANT
NDL SPNL 25GX3.5 QUINCKE BL (NEEDLE) ×1 IMPLANT
NEEDLE ANESTHESIA  27G X 3.5 (NEEDLE) ×2
NEEDLE ANESTHESIA 27G X 3.5 (NEEDLE) ×1 IMPLANT
NEEDLE SPNL 25GX3.5 QUINCKE BL (NEEDLE) ×3 IMPLANT
NS IRRIG 500ML POUR BTL (IV SOLUTION) ×3 IMPLANT
PACK DRAPE NASAL/ENT (PACKS) ×3 IMPLANT
PACKING NASAL EPIS 4X2.4 XEROG (MISCELLANEOUS) ×6 IMPLANT
PAD GROUND ADULT SPLIT (MISCELLANEOUS) ×3 IMPLANT
PATTIES SURGICAL .5 X3 (DISPOSABLE) ×3 IMPLANT
SHAVER DIEGO BLD STD TYPE A (BLADE) ×2 IMPLANT
SOL ANTI-FOG 6CC FOG-OUT (MISCELLANEOUS) ×1 IMPLANT
SOL FOG-OUT ANTI-FOG 6CC (MISCELLANEOUS) ×2
SPLINT NASAL SEPTAL BLV .50 ST (MISCELLANEOUS) ×2 IMPLANT
SPONGE XRAY 4X4 16PLY STRL (MISCELLANEOUS) ×2 IMPLANT
STRAP BODY AND KNEE 60X3 (MISCELLANEOUS) ×3 IMPLANT
SYR 3ML LL SCALE MARK (SYRINGE) ×3 IMPLANT
TOWEL OR 17X26 4PK STRL BLUE (TOWEL DISPOSABLE) ×3 IMPLANT
TRACKER CRANIALMASK (MASK) ×3 IMPLANT
TUBING DECLOG MULTIDEBRIDER (TUBING) ×2 IMPLANT
WATER STERILE IRR 250ML POUR (IV SOLUTION) ×3 IMPLANT

## 2017-10-31 NOTE — Anesthesia Procedure Notes (Signed)
Procedure Name: Intubation Date/Time: 10/31/2017 10:24 AM Performed by: Londell Moh, CRNA Pre-anesthesia Checklist: Patient identified, Emergency Drugs available, Suction available, Patient being monitored and Timeout performed Patient Re-evaluated:Patient Re-evaluated prior to induction Oxygen Delivery Method: Circle system utilized Preoxygenation: Pre-oxygenation with 100% oxygen Induction Type: IV induction Ventilation: Mask ventilation without difficulty Laryngoscope Size: Mac and 3 Grade View: Grade I Tube type: Oral Rae Tube size: 7.0 mm Number of attempts: 1 Placement Confirmation: ETT inserted through vocal cords under direct vision,  positive ETCO2 and breath sounds checked- equal and bilateral Tube secured with: Tape Dental Injury: Teeth and Oropharynx as per pre-operative assessment

## 2017-10-31 NOTE — Anesthesia Preprocedure Evaluation (Signed)
Anesthesia Evaluation  Patient identified by MRN, date of birth, ID band Patient awake    Reviewed: Allergy & Precautions, NPO status , Patient's Chart, lab work & pertinent test results, reviewed documented beta blocker date and time   Airway Mallampati: II  TM Distance: >3 FB Neck ROM: Full    Dental no notable dental hx.    Pulmonary neg pulmonary ROS,    Pulmonary exam normal breath sounds clear to auscultation       Cardiovascular negative cardio ROS Normal cardiovascular exam Rhythm:Regular Rate:Normal     Neuro/Psych  Headaches (On atenolol for this), negative psych ROS   GI/Hepatic negative GI ROS, Neg liver ROS,   Endo/Other  negative endocrine ROS  Renal/GU negative Renal ROS  negative genitourinary   Musculoskeletal negative musculoskeletal ROS (+)   Abdominal (+) + obese,   Peds  Hematology negative hematology ROS (+)   Anesthesia Other Findings   Reproductive/Obstetrics                            Anesthesia Physical Anesthesia Plan  ASA: II  Anesthesia Plan: General   Post-op Pain Management:    Induction: Intravenous  PONV Risk Score and Plan:   Airway Management Planned: Oral ETT  Additional Equipment: None  Intra-op Plan:   Post-operative Plan: Extubation in OR  Informed Consent: I have reviewed the patients History and Physical, chart, labs and discussed the procedure including the risks, benefits and alternatives for the proposed anesthesia with the patient or authorized representative who has indicated his/her understanding and acceptance.     Plan Discussed with: CRNA, Anesthesiologist and Surgeon  Anesthesia Plan Comments:         Anesthesia Quick Evaluation

## 2017-10-31 NOTE — H&P (Signed)
H&P has been reviewedand patient reevaluated,  and no changes necessary. To be downloaded later.  

## 2017-10-31 NOTE — Anesthesia Postprocedure Evaluation (Signed)
Anesthesia Post Note  Patient: Hailey Simpson  Procedure(s) Performed: IMAGE GUIDED SINUS SURGERY   REVISION SEPTUM (Bilateral ) ETHMOIDECTOMY REVISION (Bilateral ) FRONTAL SINUS REVISION (Bilateral )  Patient location during evaluation: PACU Anesthesia Type: General Level of consciousness: awake Pain management: pain level controlled Vital Signs Assessment: post-procedure vital signs reviewed and stable Respiratory status: spontaneous breathing Cardiovascular status: blood pressure returned to baseline Postop Assessment: no headache Anesthetic complications: no    Beckey DowningEric Antanasia Kaczynski

## 2017-10-31 NOTE — Op Note (Signed)
10/31/2017  11:59 AM    Hailey Simpson, Hailey Simpson  [id number]   Pre-Op Dx:  Chronic bilateral ethmoid sinusitis, chronic bilateral frontal sinusitis, septal deviation superiorly  Post-op Dx: Same  Proc: Bilateral endoscopic frontal sinusotomy revision, bilateral endoscopic ethmoidectomy revision, septoplasty revision, use of image guided system  Surg:  Stirling Orton H  Anes:  GOT  EBL:  50 mL  Comp:  None  Findings:  Some blocked inflamed ethmoid air cells on the left side and also at the frontal sinus duct opening on the lateral side. The right ethmoid air cells also were mostly opened but had a blocked air cell laterally and then more superiorly and the anterior ethmoid and frontal sinus duct opening. These were all cleaned out on both sides. The septum had the anterior ethmoid plate buckled to the left side narrowing the airway here.  Procedure: The patient was brought to the operating room placed in a supine position. The patient was given general anesthesia by oral endotracheal intubation. Her nose was prepped with 4% Xylocaine mixed with Afrin on cotton pledgets. She had 6 mL of 1% Xylocaine with epi 1:100,000 infiltrated into the septum. The image guided system was brought in and the CT scan was downloaded to the disc. The template was applied the face and the template was registered to the system. There was 0.7 mm of variance. The suction isthmus were then registered and the showed good alignment with the CT anatomy. She was prepped and draped in a sterile fashion.  The cotton pledges were removed and the 0 scope was used to visualize the airway on both sides. The superior left airway was blocked by the bony protrusion of the anterior ethmoid plate. The right side had a swell body anteriorly in this area. The left ethmoid had some evidence of scar tissue in the lateral wall and at the frontal sinus duct opening. The right side also had evidence of swelling of the lateral wall and the  anterior ethmoid area. I could not see the frontal sinus duct as well.  A left Killian incision was created in the septum with elevation of the mucoperichondrium the left side of the quadrangular plate. This was carried back to the ethmoid plate and the mucoperiosteum was elevated over the ethmoid plate. Dissection was not carried inferiorly down to the maxillary crest as this was not involved. The cartilage just anterior to ethmoid plate was incised and the mucoperichondrium was elevated on the opposite side. The mucoperiosteum was elevated over the other side of the ethmoid plate. Some of the crooked cartilage and anterior ethmoid plate there was very deviated was fractured and removed. The remaining cartilage was left intact in the posterior ethmoid bone was not addressed as this was straighter. The mucosal flaps were placed back in position were closed using a 40 plain gut suture on a mini Keith needle. This was used to close the [place] incision as well. It was used in a through and through whip stitch fashion to hold it to the midline.  The 0 scope was used the left side and the guided system was again used to evaluate where the remaining air cells were and scar tissue were. The lateral ethmoid wall was freed up of her chronic inflammation and opened up a couple air cells that were intact here. This was cleaned down to the lamina papyracea to make sure that the wall was clean. The 30 scope was used to visualize the anterior ethmoid cells and several these were opened up  and cleared. Some of the middle superior ethmoid air cells were scarred over and these were opened up as well to make sure that no disease could Hyden these areas. Mucous membranes were fairly inflamed throughout the sinuses. The image guided system was used to make sure that dissection was carried up to the fovea ethmoidalis and there were no air cells there. The lateral wall was clean. The frontal sinus duct was opened by using some  frontal sinus instruments to clean tissue laterally and open this area. There was also 1 small pocket medially that was opened adjacent to the frontal sinus duct. Using the image guided system I could tell that all the air cells were now opened and the frontal sinus duct was wide her. There is no sign for any obstruction or chronic inflammation left. Cottonoid pledgets with phenylephrine and Xylocaine were placed into the sinus temporarily while the right side was addressed.  The 0 scope was used to visualize the right sinus again. The uncinate process was not removed well on this side and there was evidence of chronic disease along the lateral wall the ethmoid sinus. Using a 90 forceps the uncinate process was completely removed all the way up to the anterior ethmoids. The lateral wall was cleaned off using the image guided system to make sure we are cleaned out every air cell and did not leave any buried disease. The anterior ethmoid was then visualized with 30 scope and the cells were opened up widely make sure there were no pockets left. The 70 scope was used to visualize into the frontal sinus duct. There was a anterior ethmoid air cell that was high medial to the duct and this was opened up to make sure it drained well and the nose. Once all the air cells were opened and verified with the image guided system then cottonoid pledgets were placed on this side while the left side was addressed.  The cottonoid pledgets remove the left side and the 30 scope was used to visualize all sinuses to make sure there were clear there is no significant disease anywhere that I could tell. Sinuses were all opened up. Xerogel was then placed in the anterior ethmoid and frontal sinus duct and then further xerogel was placed into the posterior ethmoid area up. The right side was then visualized with cottonoid pledgets removed. Again the sinuses were all open and there is no evidence of any further disease. The old blood  was suctioned out and xerogel was then placed in the anterior ethmoid area followed by further xerogel in the posterior ethmoid. This was wetted on both sides to make sure it was liquefied. Xomed 0.5 regular sized splints were then trimmed and placed on both sides of nasal septum and held in place with a 3-0 nylon suture.  The patient tolerated the procedure well. She was awakened and taken to the recovery room in satisfactory condition. There were no operative complications  Dispo:   To PACU to be discharged home  Plan:  To follow-up in the office in 6 days. She'll use saline flushes to keep her nose clean. I written for tramadol for pain since she is allergic to codeine. She has antibiotics at home that she will continue and [facility] prednisone taper over 6 days. She will call if she has any questions or problems.  Ipek Westra H  10/31/2017 11:59 AM

## 2017-10-31 NOTE — Transfer of Care (Signed)
Immediate Anesthesia Transfer of Care Note  Patient: Hailey Simpson  Procedure(s) Performed: IMAGE GUIDED SINUS SURGERY   REVISION SEPTUM (Bilateral ) ETHMOIDECTOMY REVISION (Bilateral ) FRONTAL SINUS REVISION (Bilateral )  Patient Location: PACU  Anesthesia Type: General  Level of Consciousness: awake, alert  and patient cooperative  Airway and Oxygen Therapy: Patient Spontanous Breathing and Patient connected to supplemental oxygen  Post-op Assessment: Post-op Vital signs reviewed, Patient's Cardiovascular Status Stable, Respiratory Function Stable, Patent Airway and No signs of Nausea or vomiting  Post-op Vital Signs: Reviewed and stable  Complications: No apparent anesthesia complications

## 2017-11-01 ENCOUNTER — Encounter: Payer: Self-pay | Admitting: Otolaryngology

## 2017-11-04 LAB — SURGICAL PATHOLOGY

## 2020-12-31 ENCOUNTER — Telehealth: Payer: Self-pay | Admitting: Internal Medicine

## 2020-12-31 MED ORDER — AZITHROMYCIN 250 MG PO TABS: ORAL_TABLET | ORAL | 0 refills | Status: AC

## 2020-12-31 MED ORDER — PREDNISONE 10 MG PO TABS: ORAL_TABLET | ORAL | 0 refills | Status: AC

## 2020-12-31 NOTE — Telephone Encounter (Signed)
Mother asked me to call her. Son and husband Covid +. She hasn't tested. Son exposed day care and brought it home. Now sick 1 week- sore throat, headache, malaise, bad cough productive "chunks". No definite fever. She is aware to go to local hospital if badly sick. Antibody infusions etc in short supply. Plan- Zpak and prednisone to local pharmacy [facility]    Name Frederickson

## 2023-06-02 ENCOUNTER — Telehealth: Payer: Self-pay | Admitting: Internal Medicine

## 2023-06-02 MED ORDER — AMOXICILLIN-POT CLAVULANATE 875-125 MG PO TABS: 1.0000 | ORAL_TABLET | Freq: Two times a day (BID) | ORAL | 1 refills | Status: AC

## 2023-06-02 NOTE — Telephone Encounter (Signed)
Acute maxillary sinusitis Sending augmentin

## 2023-06-03 NOTE — Telephone Encounter (Signed)
error 

## 2023-12-25 ENCOUNTER — Telehealth: Payer: Self-pay | Admitting: Internal Medicine

## 2023-12-25 MED ORDER — ZOLPIDEM TARTRATE 5 MG PO TABS: ORAL_TABLET | ORAL | 5 refills | Status: DC

## 2023-12-25 NOTE — Telephone Encounter (Signed)
 Script didn't go through. Resending ambien 5 mg for trial for insomnia.

## 2024-10-13 ENCOUNTER — Telehealth: Payer: Self-pay | Admitting: Internal Medicine

## 2024-10-13 MED ORDER — TEMAZEPAM 30 MG PO CAPS: 30.0000 mg | ORAL_CAPSULE | Freq: Every evening | ORAL | 5 refills | Status: AC | PRN

## 2024-10-13 NOTE — Telephone Encounter (Signed)
 Insomnia acting up - busy brain, waking 3AM unable to get back to sleep. When she had ambien  she had to take 2. We will try temazepam.
